# Patient Record
Sex: Female | Born: 1965 | Race: White | Hispanic: No | Smoking: Never smoker
Health system: Southern US, Community
[De-identification: ages and names within clinical notes are randomized; demographics above are authoritative.]

---

## 2017-06-03 DIAGNOSIS — M79604 Pain in right leg: Secondary | ICD-10-CM | POA: Diagnosis present

## 2017-06-03 DIAGNOSIS — Z91040 Latex allergy status: Secondary | ICD-10-CM | POA: Diagnosis not present

## 2017-06-04 ENCOUNTER — Other Ambulatory Visit: Payer: Self-pay

## 2017-06-04 ENCOUNTER — Emergency Department (HOSPITAL_COMMUNITY): Payer: BLUE CROSS/BLUE SHIELD

## 2017-06-04 ENCOUNTER — Ambulatory Visit (HOSPITAL_BASED_OUTPATIENT_CLINIC_OR_DEPARTMENT_OTHER)
Admission: RE | Admit: 2017-06-04 | Discharge: 2017-06-04 | Disposition: A | Discharge status: Home / Self Care | Payer: BLUE CROSS/BLUE SHIELD | Source: Ambulatory Visit | Attending: Emergency Medicine | Admitting: Emergency Medicine

## 2017-06-04 ENCOUNTER — Emergency Department (HOSPITAL_COMMUNITY)
Admission: EM | Admit: 2017-06-04 | Discharge: 2017-06-04 | Disposition: A | Discharge status: Home / Self Care | Payer: BLUE CROSS/BLUE SHIELD | Attending: Emergency Medicine | Admitting: Emergency Medicine

## 2017-06-04 ENCOUNTER — Encounter (HOSPITAL_COMMUNITY): Payer: Self-pay | Admitting: Emergency Medicine

## 2017-06-04 DIAGNOSIS — M79604 Pain in right leg: Secondary | ICD-10-CM | POA: Insufficient documentation

## 2017-06-04 DIAGNOSIS — M7989 Other specified soft tissue disorders: Secondary | ICD-10-CM

## 2017-06-04 DIAGNOSIS — M79609 Pain in unspecified limb: Secondary | ICD-10-CM | POA: Diagnosis not present

## 2017-06-04 MED ORDER — ENOXAPARIN SODIUM 60 MG/0.6ML ~~LOC~~ SOLN
57.0000 mg | Freq: Once | SUBCUTANEOUS | Status: AC
  Administered 2017-06-04: 55 mg via SUBCUTANEOUS
  Filled 2017-06-04: qty 0.55

## 2017-06-04 MED ORDER — IBUPROFEN 800 MG PO TABS
800.0000 mg | ORAL_TABLET | Freq: Once | ORAL | Status: AC
  Administered 2017-06-04: 800 mg via ORAL
  Filled 2017-06-04: qty 1

## 2017-06-04 NOTE — ED Triage Notes (Signed)
Patient complaining of leg pain. Patient states she was seen last week and d dimer was high. Patient thinks she has a blood clot in her right leg. Patient states that the left leg is swollen and foot started feeling numb.

## 2017-06-04 NOTE — Discharge Instructions (Addendum)
You may alternate Tylenol 1000 mg every 6 hours as needed for pain and Ibuprofen 800 mg every 8 hours as needed for pain.  Please take Ibuprofen with food. ° °

## 2017-06-04 NOTE — ED Provider Notes (Signed)
TIME SEEN: 4:56 AM  CHIEF COMPLAINT: Right leg pain  HPI: Patient is a 52 year old female with no significant past medical history who presents to the emergency department with a week of right leg pain that is throbbing with associated swelling.  No known injury that she can recall.  Pain is mostly in the ankle and the anterior shin but she does have some calf tenderness.  She reports that she is here with her family for a soccer tournament.  She is from [place].  States that the pain and swelling is worse after driving the car for 4 hours.  She does report that a week ago she was in the emergency department in [place] for chest pain.  She had a d-dimer at that time which was elevated and they obtained a CTA of her chest which showed no pulmonary embolus.  She has no history of PE or DVT but is very concerned that she could have a DVT today.  No fever, redness or warmth to the leg.  No numbness or focal weakness.  She is able to ambulate.  ROS: See HPI Constitutional: no fever  Eyes: no drainage  ENT: no runny nose   Cardiovascular:  no chest pain  Resp: no SOB  GI: no vomiting GU: no dysuria Integumentary: no rash  Allergy: no hives  Musculoskeletal: no leg swelling  Neurological: no slurred speech ROS otherwise negative  PAST MEDICAL HISTORY/PAST SURGICAL HISTORY:  History reviewed. No pertinent past medical history.  MEDICATIONS:  Prior to Admission medications   Medication Sig Start Date End Date Taking? Authorizing Provider  PRESCRIPTION MEDICATION Apply 1 application topically 2 (two) times daily. Estrogen cream   Yes [provider]    ALLERGIES:  Allergies  Allergen Reactions  . Latex Itching    SOCIAL HISTORY:  Social History   Tobacco Use  . Smoking status: Never Smoker  . Smokeless tobacco: Never Used  Substance Use Topics  . Alcohol use: Never    Frequency: Never    FAMILY HISTORY: History reviewed. No pertinent family history.  EXAM: Pulse  60   Temp 98.1 F (36.7 C) (Oral)   Resp 18   Ht 5\' 4"  (1.626 m)   Wt 56.7 kg (125 lb)   SpO2 99%   BMI 21.46 kg/m  CONSTITUTIONAL: Alert and oriented and responds appropriately to questions. Well-appearing; well-nourished HEAD: Normocephalic EYES: Conjunctivae clear, pupils appear equal, EOMI ENT: normal nose; moist mucous membranes NECK: Supple, no meningismus, no nuchal rigidity, no LAD  CARD: RRR; S1 and S2 appreciated; no murmurs, no clicks, no rubs, no gallops RESP: Normal chest excursion without splinting or tachypnea; breath sounds clear and equal bilaterally; no wheezes, no rhonchi, no rales, no hypoxia or respiratory distress, speaking full sentences ABD/GI: Normal bowel sounds; non-distended; soft, non-tender, no rebound, no guarding, no peritoneal signs, no hepatosplenomegaly BACK:  The back appears normal and is non-tender to palpation, there is no CVA tenderness EXT: Patient is tender to palpation over the anterior right shin diffusely over the ankle without ligamentous laxity.  There is some soft tissue edema noted that is nonpitting.  No ecchymosis or bony deformity.  She is able to ambulate without limp.  2+ right DP pulse.  No ligamentous laxity noted.  No tenderness over the knee or hip joints.  Normal ROM in all joints; otherwise extremities are non-tender to palpation; no edema; normal capillary refill; no cyanosis, she does have a small amount of right calf tenderness on exam with no  cords, compartments of the right leg are soft SKIN: Normal color for age and race; warm; no rash NEURO: Moves all extremities equally PSYCH: The patient's mood and manner are appropriate. Grooming and personal hygiene are appropriate.  MEDICAL DECISION MAKING: Patient here with right leg pain.  No signs of cellulitis, septic arthritis, necrotizing fasciitis, compartment syndrome, arterial obstruction.  I have obtained x-rays which showed no acute abnormality.  I agree that it would not be  unreasonable to obtain venous Doppler in the morning.  Will give dose of Lovenox and she will go to [facility] to have this done.  Have given her ibuprofen for pain.  Recommended rest, elevation, ice and alternating Tylenol and Motrin.  If Doppler is negative, she will plan to follow-up with orthopedic doctor when she returns to [place].  She is not having any chest pain or shortness of breath at this time.  At this time, I do not feel there is any life-threatening condition present. I have reviewed and discussed all results (EKG, imaging, lab, urine as appropriate) and exam findings with patient/family. I have reviewed nursing notes and appropriate previous records.  I feel the patient is safe to be discharged home without further emergent workup and can continue workup as an outpatient as needed. Discussed usual and customary return precautions. Patient/family verbalize understanding and are comfortable with this plan.  Outpatient follow-up has been provided if needed. All questions have been answered.      Ward, Layla Maw, DO 06/04/17 [phone]

## 2017-06-04 NOTE — Progress Notes (Signed)
VASCULAR LAB PRELIMINARY  PRELIMINARY  PRELIMINARY  PRELIMINARY  Right lower extremity venous duplex completed.    Preliminary report:  There is no DVT or SVT noted in the right lower extremity.   Mykah Shin, RVT 06/04/2017, 8:07 AM

## 2019-07-04 IMAGING — CR DG TIBIA/FIBULA 2V*R*
2 series · 2 of 2 positions shown · non-contrast
Comparison: None.

CLINICAL DATA: 51-year-old female with right lower extremity pain
and swelling.

EXAM:
RIGHT ANKLE - COMPLETE 3+ VIEW; RIGHT TIBIA AND FIBULA - 2 VIEW

[x tib-fib ap right]
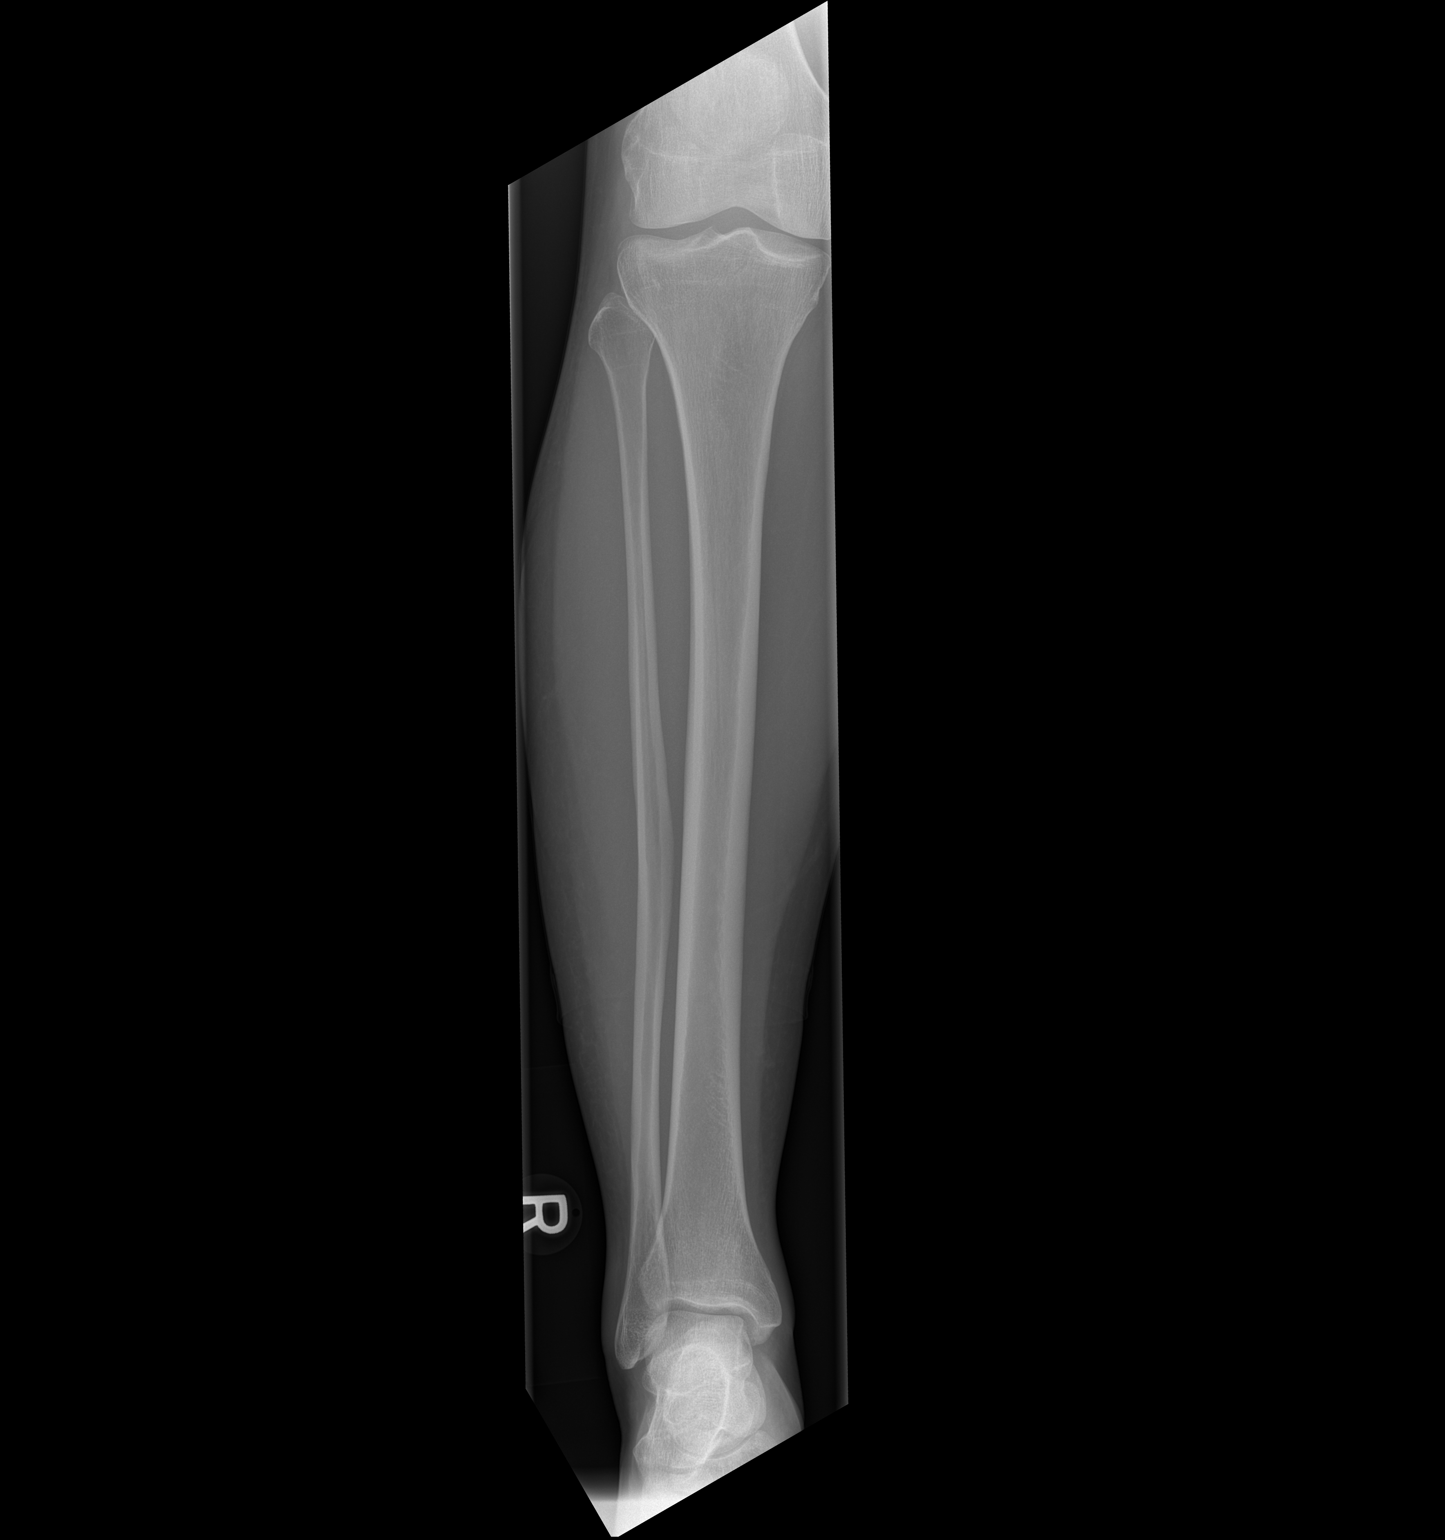

[x tib-fib lat right]
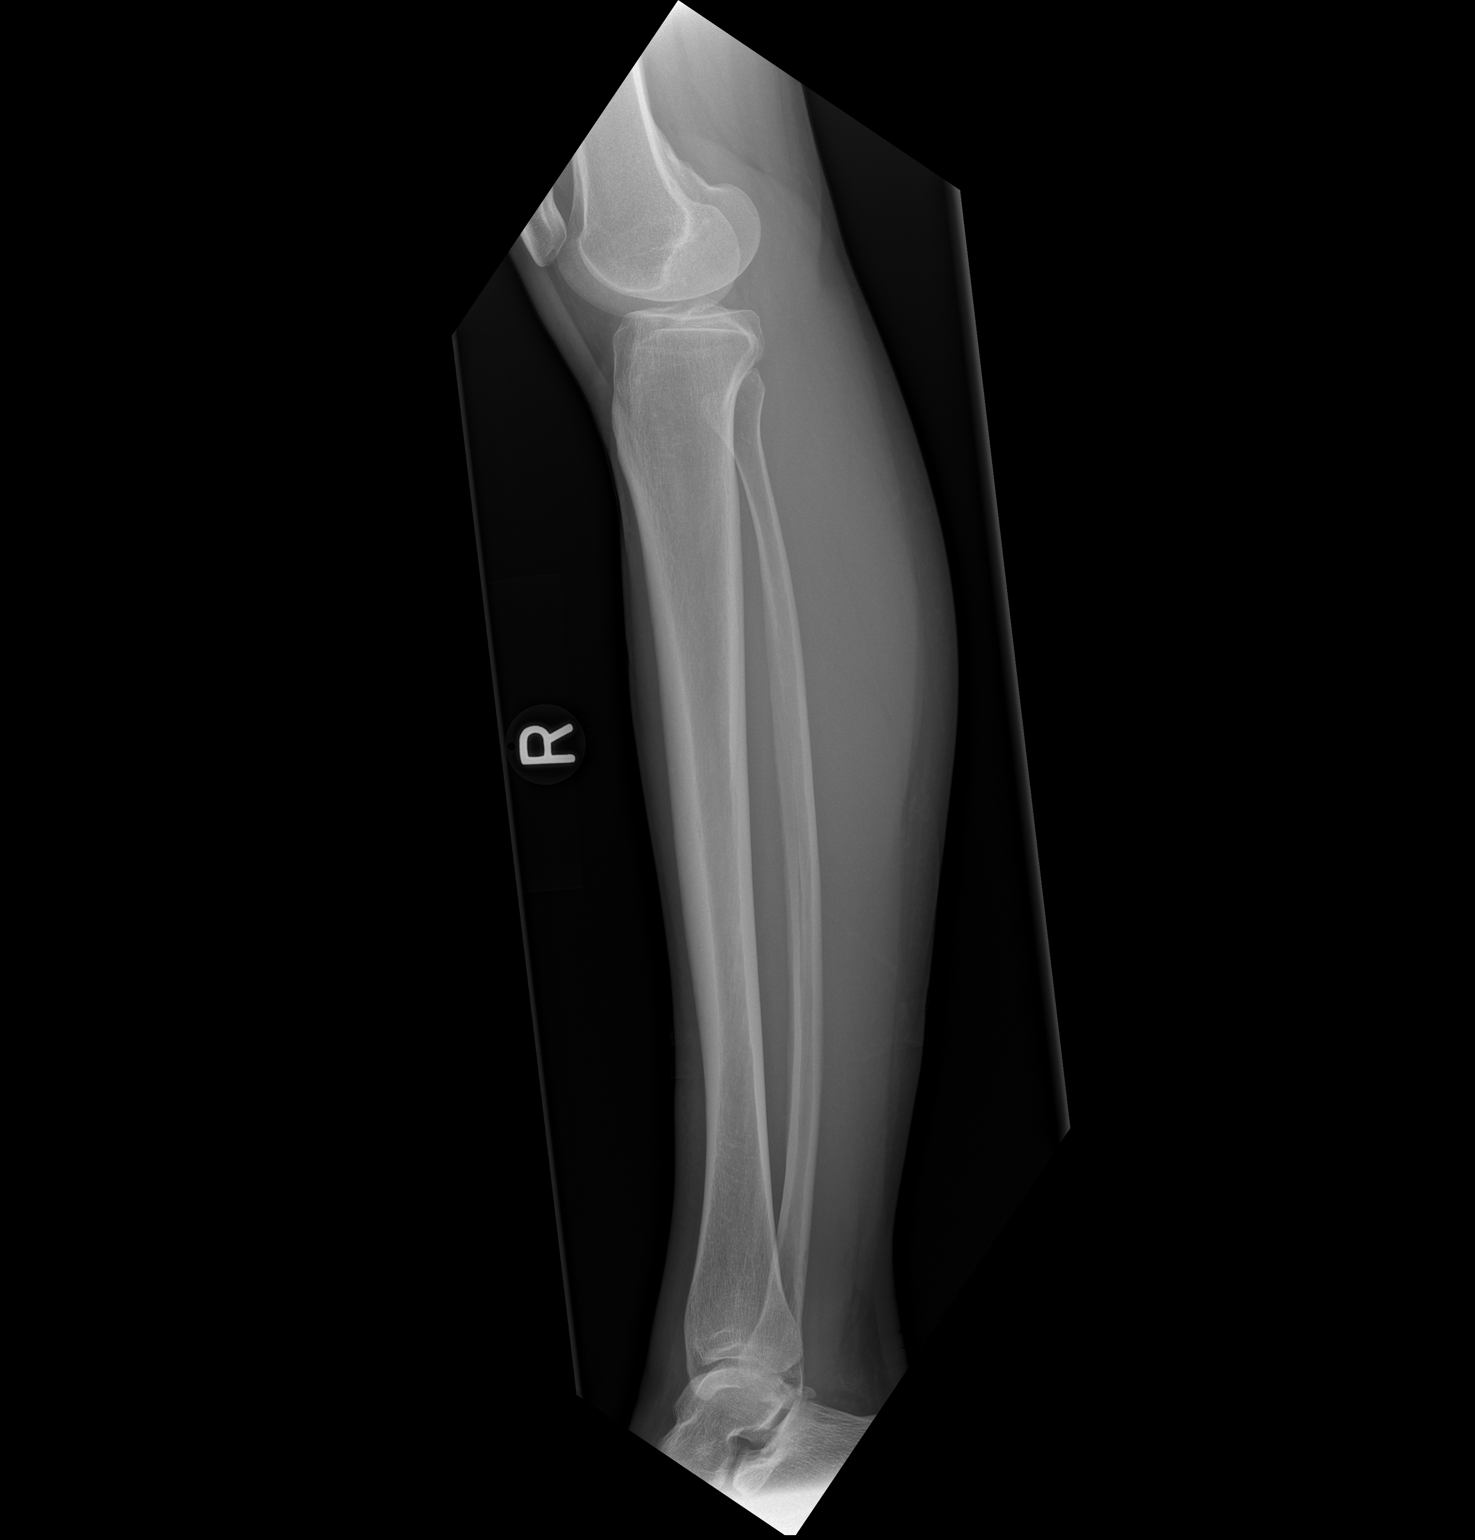

[2 of 2 positions shown; findings below may reference images not displayed]

FINDINGS: There is no evidence of fracture, dislocation, or joint effusion.
There is no evidence of arthropathy or other focal bone abnormality.
Soft tissues are unremarkable.
IMPRESSION: Negative.

## 2019-07-04 IMAGING — CR DG ANKLE COMPLETE 3+V*R*
3 series · 3 of 3 positions shown · non-contrast
Comparison: None.

CLINICAL DATA: 51-year-old female with right lower extremity pain
and swelling.

EXAM:
RIGHT ANKLE - COMPLETE 3+ VIEW; RIGHT TIBIA AND FIBULA - 2 VIEW

[x ankle ap right]
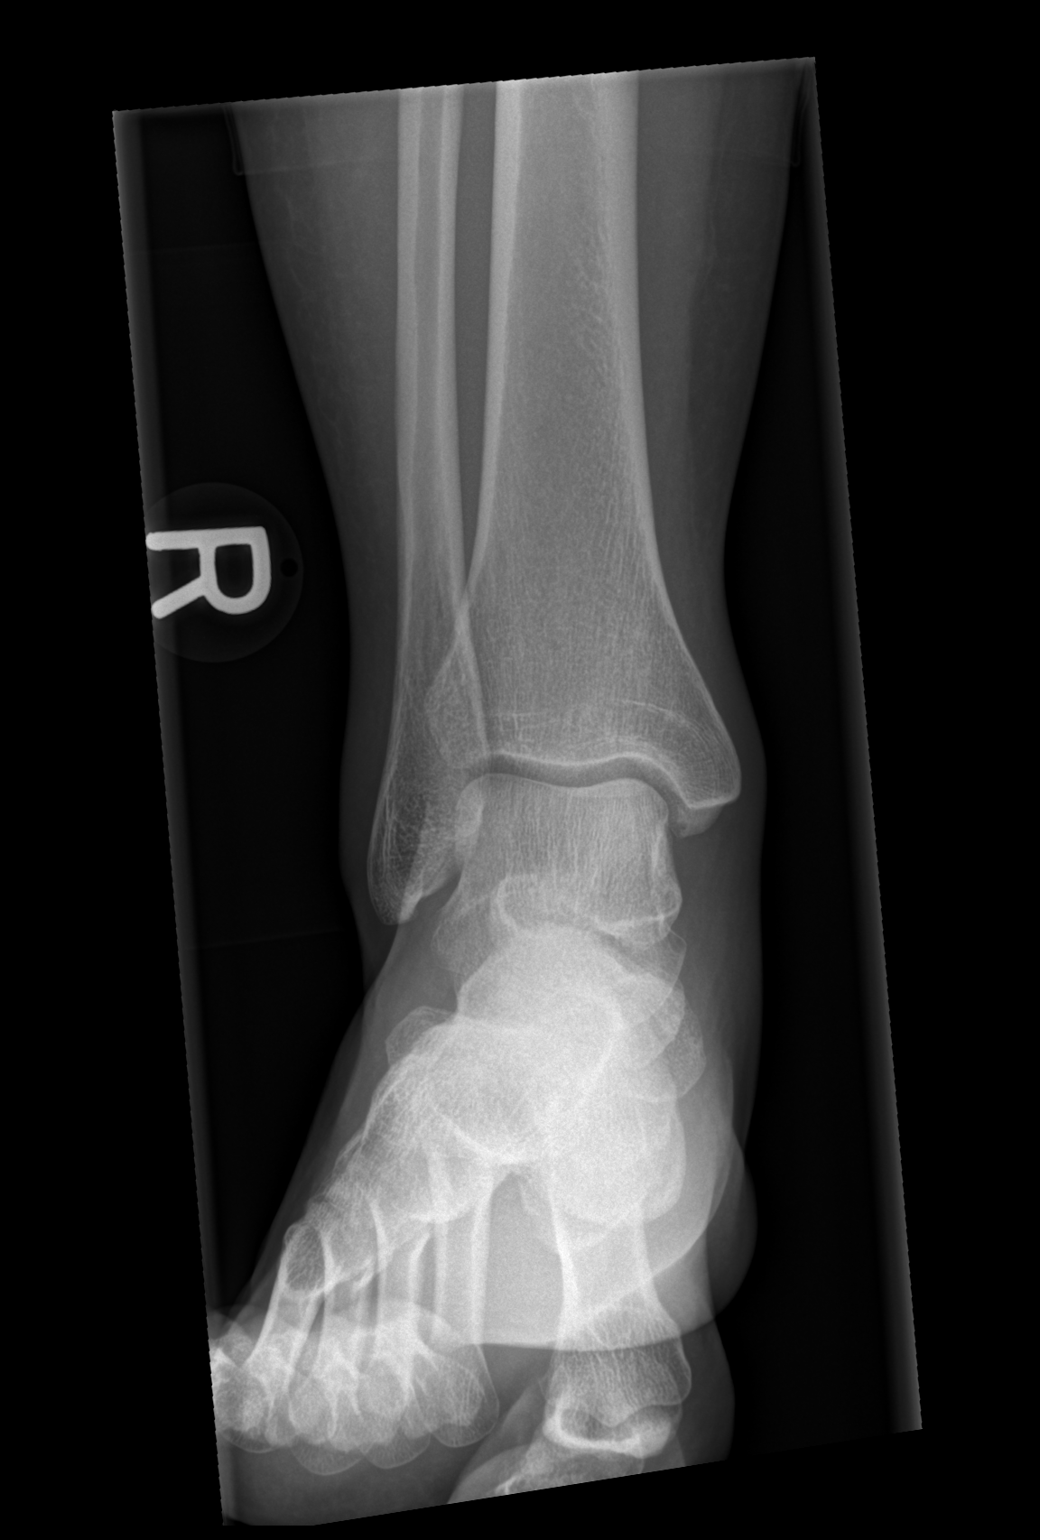

[x ankle obl right]
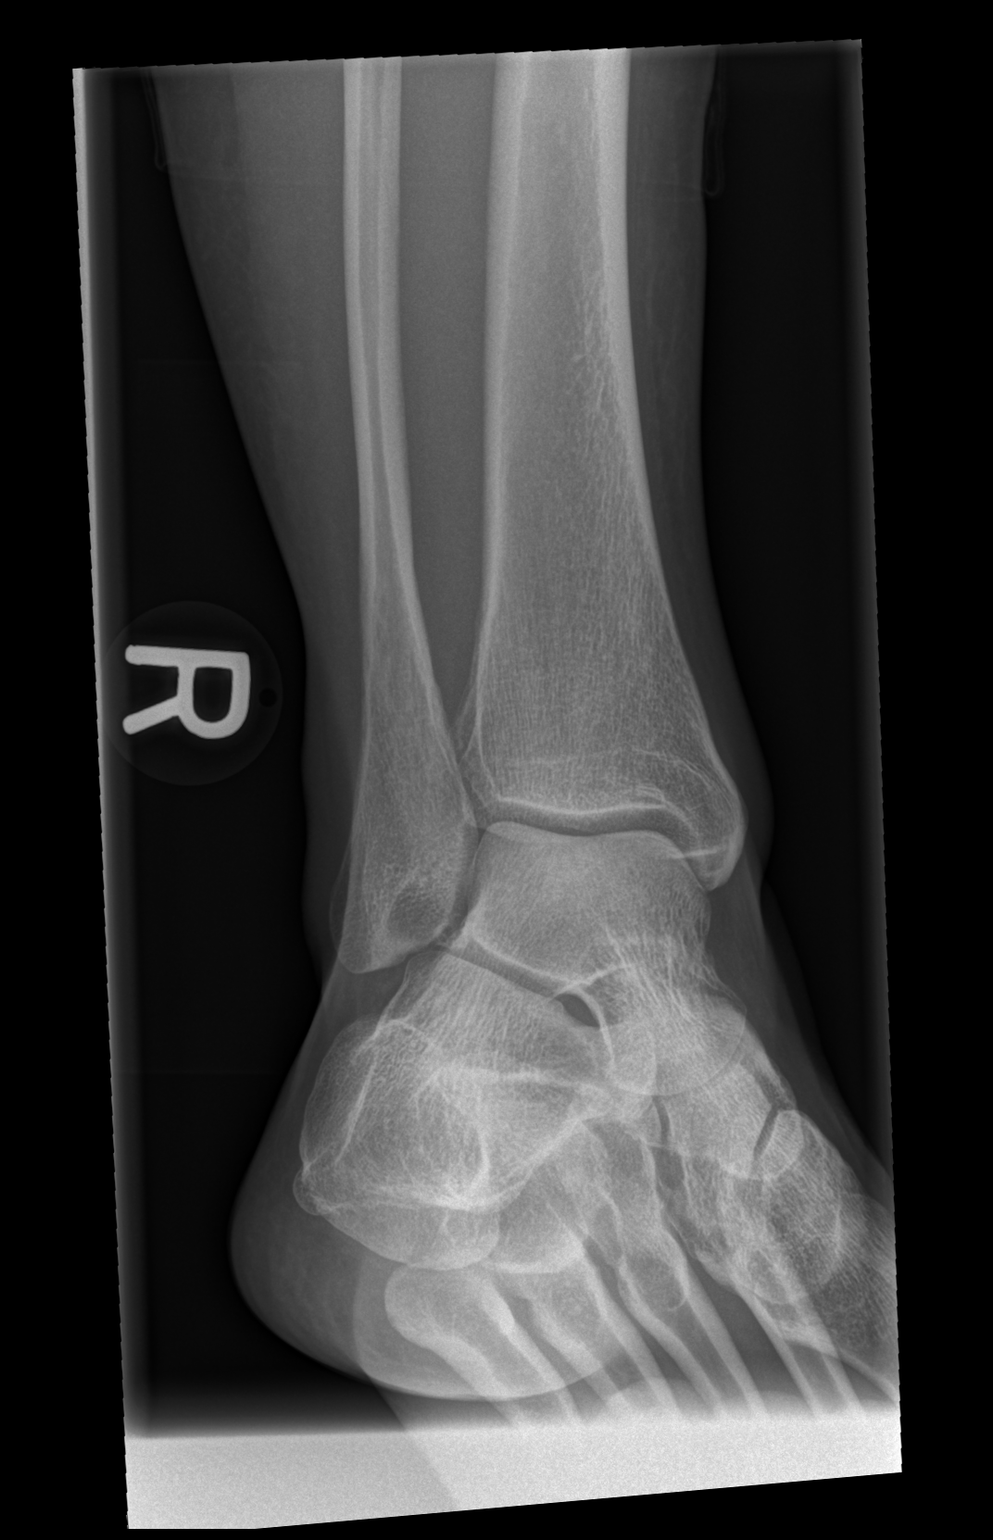

[x ankle lat right]
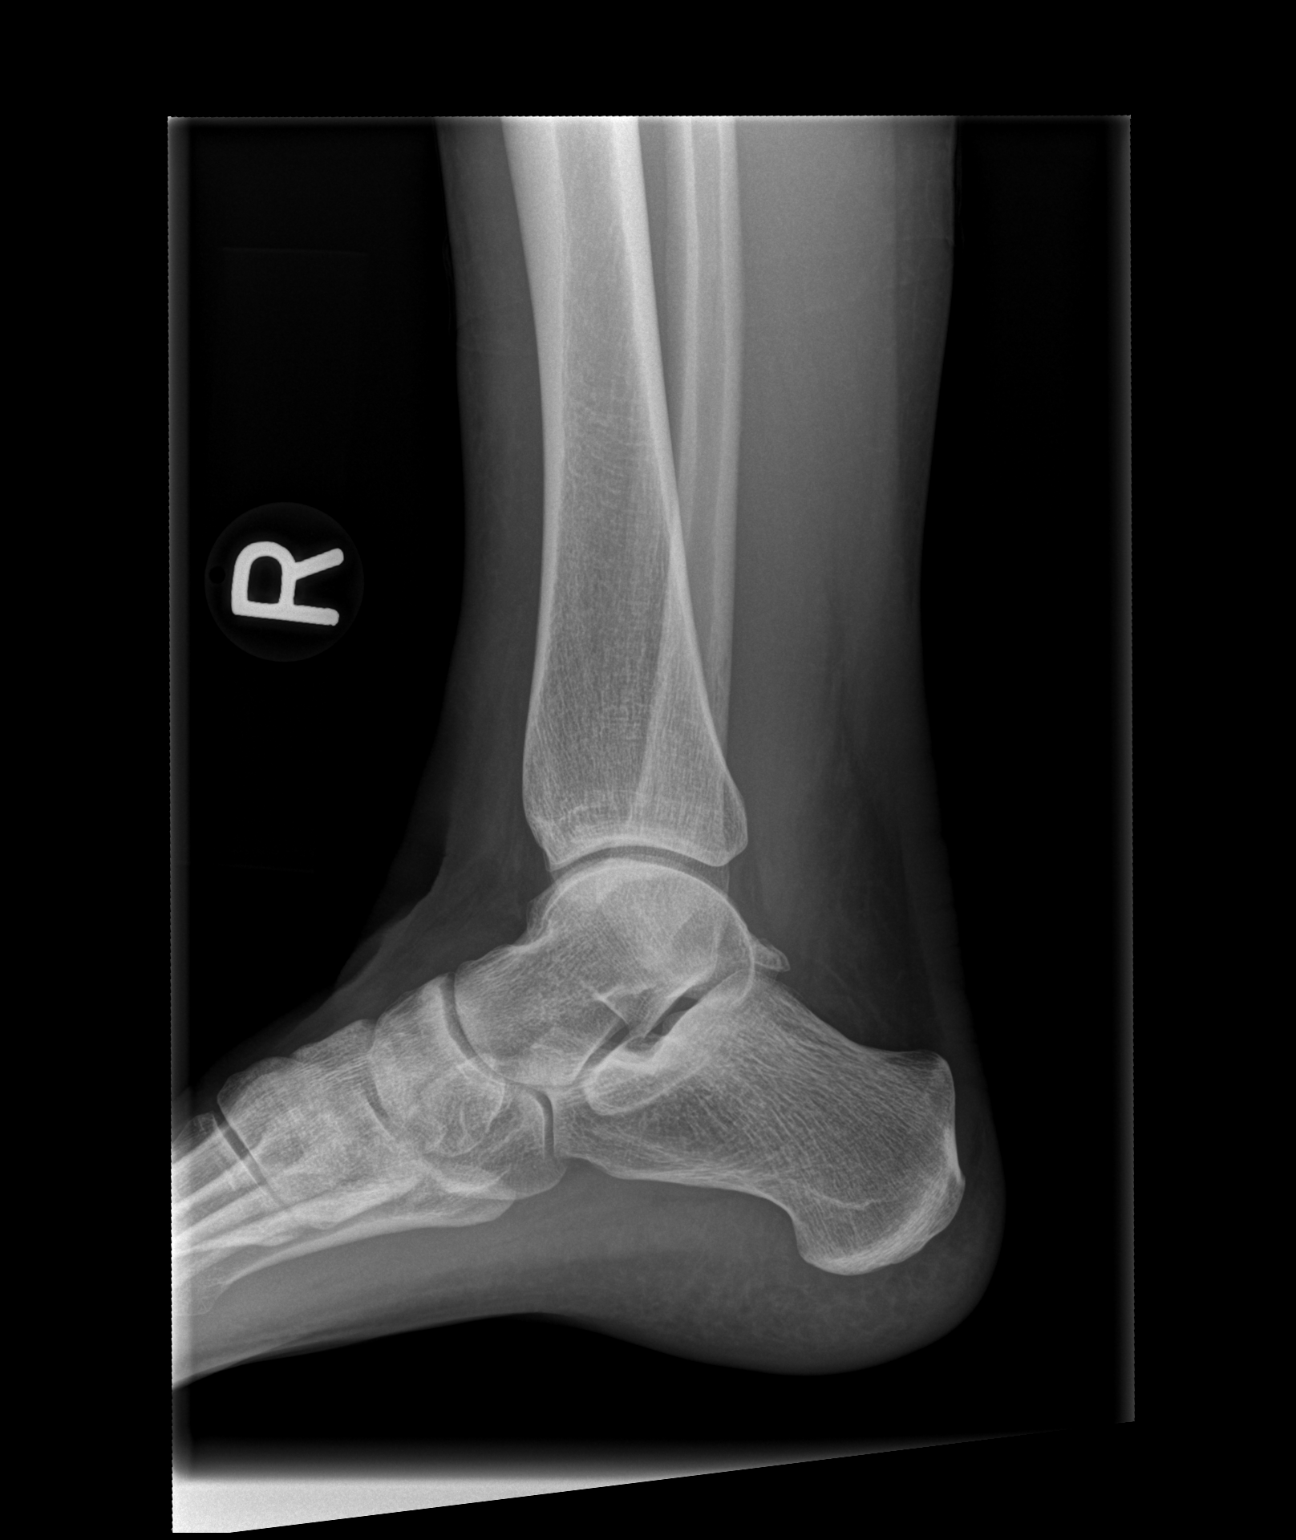

[3 of 3 positions shown; findings below may reference images not displayed]

FINDINGS: There is no evidence of fracture, dislocation, or joint effusion.
There is no evidence of arthropathy or other focal bone abnormality.
Soft tissues are unremarkable.
IMPRESSION: Negative.
